# Patient Record
Sex: Female | Born: 1990 | Race: White | Hispanic: Yes | Marital: Married | Smoking: Never smoker
Health system: Southern US, Community
[De-identification: ages and names within clinical notes are randomized; demographics above are authoritative.]

## PROBLEM LIST (undated history)

## (undated) DIAGNOSIS — Z973 Presence of spectacles and contact lenses: Secondary | ICD-10-CM

## (undated) DIAGNOSIS — R51 Headache: Secondary | ICD-10-CM

## (undated) DIAGNOSIS — N9089 Other specified noninflammatory disorders of vulva and perineum: Secondary | ICD-10-CM

## (undated) DIAGNOSIS — G43109 Migraine with aura, not intractable, without status migrainosus: Secondary | ICD-10-CM

## (undated) DIAGNOSIS — R519 Headache, unspecified: Secondary | ICD-10-CM

## (undated) HISTORY — PX: NO PAST SURGERIES: SHX2092

## (undated) HISTORY — PX: WISDOM TOOTH EXTRACTION: SHX21

## (undated) HISTORY — DX: Headache: R51

## (undated) HISTORY — DX: Headache, unspecified: R51.9

---

## 2017-09-27 ENCOUNTER — Encounter (INDEPENDENT_AMBULATORY_CARE_PROVIDER_SITE_OTHER): Payer: Self-pay

## 2017-09-27 ENCOUNTER — Encounter: Payer: Self-pay | Admitting: Family Medicine

## 2017-09-27 ENCOUNTER — Ambulatory Visit: Payer: 59 | Admitting: Family Medicine

## 2017-09-27 VITALS — BP 118/82 | HR 100 | Temp 98.4°F | Ht 64.0 in | Wt 129.8 lb

## 2017-09-27 DIAGNOSIS — Z7689 Persons encountering health services in other specified circumstances: Secondary | ICD-10-CM | POA: Diagnosis not present

## 2017-09-27 DIAGNOSIS — M5441 Lumbago with sciatica, right side: Secondary | ICD-10-CM | POA: Diagnosis not present

## 2017-09-27 DIAGNOSIS — M25531 Pain in right wrist: Secondary | ICD-10-CM

## 2017-09-27 DIAGNOSIS — G44219 Episodic tension-type headache, not intractable: Secondary | ICD-10-CM

## 2017-09-27 DIAGNOSIS — G8929 Other chronic pain: Secondary | ICD-10-CM
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
## 2017-09-27 NOTE — Progress Notes (Signed)
xsc

## 2017-09-27 NOTE — Patient Instructions (Addendum)
Can take liquid tylenol- 500 mg or advil 200-400 mg (generic is fine)    Back Exercises If you have pain in your back, do these exercises 2-3 times each day or as told by your doctor. When the pain goes away, do the exercises once each day, but repeat the steps more times for each exercise (do more repetitions). If you do not have pain in your back, do these exercises once each day or as told by your doctor. Exercises Single Knee to Chest  Do these steps 3-5 times in a row for each leg: 1. Lie on your back on a firm bed or the floor with your legs stretched out. 2. Bring one knee to your chest. 3. Hold your knee to your chest by grabbing your knee or thigh. 4. Pull on your knee until you feel a gentle stretch in your lower back. 5. Keep doing the stretch for 10-30 seconds. 6. Slowly let go of your leg and straighten it.  Pelvic Tilt  Do these steps 5-10 times in a row: 1. Lie on your back on a firm bed or the floor with your legs stretched out. 2. Bend your knees so they point up to the ceiling. Your feet should be flat on the floor. 3. Tighten your lower belly (abdomen) muscles to press your lower back against the floor. This will make your tailbone point up to the ceiling instead of pointing down to your feet or the floor. 4. Stay in this position for 5-10 seconds while you gently tighten your muscles and breathe evenly.  Cat-Cow  Do these steps until your lower back bends more easily: 1. Get on your hands and knees on a firm surface. Keep your hands under your shoulders, and keep your knees under your hips. You may put padding under your knees. 2. Let your head hang down, and make your tailbone point down to the floor so your lower back is round like the back of a cat. 3. Stay in this position for 5 seconds. 4. Slowly lift your head and make your tailbone point up to the ceiling so your back hangs low (sags) like the back of a cow. 5. Stay in this position for 5  seconds.  Press-Ups  Do these steps 5-10 times in a row: 1. Lie on your belly (face-down) on the floor. 2. Place your hands near your head, about shoulder-width apart. 3. While you keep your back relaxed and keep your hips on the floor, slowly straighten your arms to raise the top half of your body and lift your shoulders. Do not use your back muscles. To make yourself more comfortable, you may change where you place your hands. 4. Stay in this position for 5 seconds. 5. Slowly return to lying flat on the floor.  Bridges  Do these steps 10 times in a row: 1. Lie on your back on a firm surface. 2. Bend your knees so they point up to the ceiling. Your feet should be flat on the floor. 3. Tighten your butt muscles and lift your butt off of the floor until your waist is almost as high as your knees. If you do not feel the muscles working in your butt and the back of your thighs, slide your feet 1-2 inches farther away from your butt. 4. Stay in this position for 3-5 seconds. 5. Slowly lower your butt to the floor, and let your butt muscles relax.  If this exercise is too easy, try doing it  with your arms crossed over your chest. Belly Crunches  Do these steps 5-10 times in a row: 1. Lie on your back on a firm bed or the floor with your legs stretched out. 2. Bend your knees so they point up to the ceiling. Your feet should be flat on the floor. 3. Cross your arms over your chest. 4. Tip your chin a little bit toward your chest but do not bend your neck. 5. Tighten your belly muscles and slowly raise your chest just enough to lift your shoulder blades a tiny bit off of the floor. 6. Slowly lower your chest and your head to the floor.  Back Lifts Do these steps 5-10 times in a row: 1. Lie on your belly (face-down) with your arms at your sides, and rest your forehead on the floor. 2. Tighten the muscles in your legs and your butt. 3. Slowly lift your chest off of the floor while you keep  your hips on the floor. Keep the back of your head in line with the curve in your back. Look at the floor while you do this. 4. Stay in this position for 3-5 seconds. 5. Slowly lower your chest and your face to the floor.  Contact a doctor if:  Your back pain gets a lot worse when you do an exercise.  Your back pain does not lessen 2 hours after you exercise. If you have any of these problems, stop doing the exercises. Do not do them again unless your doctor says it is okay. Get help right away if:  You have sudden, very bad back pain. If this happens, stop doing the exercises. Do not do them again unless your doctor says it is okay. This information is not intended to replace advice given to you by your health care provider. Make sure you discuss any questions you have with your health care provider. Document Released: 03/26/2010 Document Revised: 07/30/2015 Document Reviewed: 04/17/2014 Elsevier Interactive Patient Education  [employer].

## 2018-04-18 ENCOUNTER — Encounter: Payer: Self-pay | Admitting: Family Medicine

## 2018-04-18 ENCOUNTER — Other Ambulatory Visit: Payer: Self-pay

## 2018-04-18 ENCOUNTER — Ambulatory Visit: Payer: 59 | Admitting: Family Medicine

## 2018-04-18 VITALS — BP 102/74 | HR 78 | Temp 98.3°F | Resp 16 | Ht 64.0 in | Wt 125.8 lb

## 2018-04-18 DIAGNOSIS — Z566 Other physical and mental strain related to work: Secondary | ICD-10-CM

## 2018-04-18 DIAGNOSIS — R0789 Other chest pain: Secondary | ICD-10-CM

## 2018-04-18 NOTE — Progress Notes (Signed)
   Subjective:    Patient ID: Sierra Washington, female    DOB: 1990/05/12, 28 y.o.   MRN: [id number]  HPI This is a 28 yo female who presents today for follow up of singular episode of chest pain that occurred about 8 days ago. She felt tightness, center of chest, had left leg pain, felt off balance. At the time this occurred, she was in the ER with her boyfriend, they had been waiting for 5 hours, she had not eaten in many hours. Symptoms resolved spontaneously within an hour. Has been doing elliptical 30 minutes a day. Never any chest pain with exercise. Eating healthier. Increased work stress. Occasional anxiety at work, she will walk away and take a break and feeling passes.   Past Medical History:  Diagnosis Date  . Frequent headaches    1year    No past surgical history on file. No family history on file. Social History   Tobacco Use  . Smoking status: Never Smoker  . Smokeless tobacco: Never Used  Substance Use Topics  . Alcohol use: Yes    Comment: occ  . Drug use: Never      Review of Systems Per HPI    Objective:   Physical Exam Physical Exam  Constitutional: Oriented to person, place, and time. She appears well-developed and well-nourished.  HENT:  Head: Normocephalic and atraumatic.  Eyes: Conjunctivae are normal.  Neck: Normal range of motion. Neck supple.  Cardiovascular: Normal rate, regular rhythm and normal heart sounds.   Pulmonary/Chest: Effort normal and breath sounds normal.  Musculoskeletal: Normal range of motion. No LE edema.  Neurological: Alert and oriented to person, place, and time.  Skin: Skin is warm and dry.  Psychiatric: Normal mood and affect. Behavior is normal. Judgment and thought content normal.  Vitals reviewed.     BP 102/74 (BP Location: Left Arm, Patient Position: Sitting, Cuff Size: Normal)   Pulse 78   Temp 98.3 F (36.8 C) (Oral)   Resp 16   Ht 5\' 4"  (1.626 m)   Wt 125 lb 12.8 oz (57.1 kg)   SpO2 99%   BMI 21.59 kg/m    Wt Readings from Last 3 Encounters:  04/18/18 125 lb 12.8 oz (57.1 kg)  09/27/17 129 lb 12 oz (58.9 kg)       Assessment & Plan:  1. Other chest pain - not concerning for cardiac etiology, no symptoms with exercise, likely due to stress - she was instructed to follow up if any additional episodes  2. Work stress - encouraged balance with work, leisure, continue exercise, consider adding yoga/medication - follow up prn   Clarene Reamer, FNP-BC  [facility] at [facility], [place]  04/18/2018 11:22 AM

## 2018-04-18 NOTE — Patient Instructions (Signed)
Good to see you today  Let me know if you have any additional symptoms

## 2018-09-07 ENCOUNTER — Ambulatory Visit (INDEPENDENT_AMBULATORY_CARE_PROVIDER_SITE_OTHER)
Admission: RE | Admit: 2018-09-07 | Discharge: 2018-09-07 | Disposition: A | Discharge status: Home / Self Care | Payer: Self-pay | Source: Ambulatory Visit

## 2018-09-07 ENCOUNTER — Encounter: Payer: Self-pay | Admitting: Family Medicine

## 2018-09-07 DIAGNOSIS — H60502 Unspecified acute noninfective otitis externa, left ear: Secondary | ICD-10-CM

## 2018-09-07 MED ORDER — OFLOXACIN 0.3 % OT SOLN: 3.0000 [drp] | Freq: Two times a day (BID) | OTIC | 0 refills | Status: AC

## 2018-09-07 NOTE — ED Provider Notes (Signed)
Virtual Visit via Video Note:  Sierra Washington  initiated request for Telemedicine visit with [facility] team. I connected with Sierra Washington  on 09/07/2018 at 9:02 AM  for a synchronized telemedicine visit using a video enabled HIPPA compliant telemedicine application. I verified that I am speaking with Sierra Washington  using two identifiers. [place] Hall-Potvin, PA-C  was physically located in a [facility] site and Daron Breeding was located at a different location.   The limitations of evaluation and management by telemedicine as well as the availability of in-person appointments were discussed. Patient was informed that she  may incur a bill ( including co-pay) for this virtual visit encounter. Sierra Washington  expressed understanding and gave verbal consent to proceed with virtual visit.     History of Present Illness:Sierra Washington  is a 28 y.o. female presents with acute concern of left ear pain.  Patient states that her pain started on Monday with some external ear swelling, particularly near her tragus.  Patient states that her ear was tender palpation.  Patient states that her swelling and pain is improved, though now she has had some ear canal swelling, and yellow, creamy discharge.  [place]  Patient denies malodor, fever, chills, myalgias, eye/nose/throat/jaw pain.  Patient denies significant ear swelling.  Patient states that her hearing is a little muffled, though "is not that bad ".  Patient denies tinnitus, dizziness, bloody discharge.  Past Medical History:  Diagnosis Date  . Frequent headaches    1year     No Known Allergies      Observations/Objective: 28 year old female who is well-nourished, well-developed in no acute distress.  Is not muffled and patient is able to speak clearly without pausing, gasping, wheezing.  Assessment and Plan: 28 year old female with acute left ear pain and swelling.   History consistent with acute otitis externa.  Will treat with ofloxacin drops, defer steroid drops as patient was unable to be evaluated, unsure if TM is perforated, though not likely.  Follow Up Instructions: Patient instructed to follow-up in office if symptoms do not improve within the next 48-72 hours or should she develop fever.  Patient verbalized understanding.   I discussed the assessment and treatment plan with the patient. The patient was provided an opportunity to ask questions and all were answered. The patient agreed with the plan and demonstrated an understanding of the instructions.   The patient was advised to call back or seek an in-person evaluation if the symptoms worsen or if the condition fails to improve as anticipated.  I provided 15 minutes of non-face-to-face time during this encounter.    [place] Hall-Potvin, PA-C  09/07/2018 9:02 AM        Hall-Potvin, [place], PA-C 09/07/18 [phone]

## 2018-09-07 NOTE — Discharge Instructions (Addendum)
Use eardrops as directed

## 2018-09-10 NOTE — Telephone Encounter (Signed)
See other mychart message also

## 2019-03-09 ENCOUNTER — Emergency Department (HOSPITAL_COMMUNITY)
Admission: EM | Admit: 2019-03-09 | Discharge: 2019-03-10 | Disposition: A | Discharge status: Home / Self Care | Payer: BC Managed Care – PPO | Attending: Emergency Medicine | Admitting: Emergency Medicine

## 2019-03-09 ENCOUNTER — Emergency Department (HOSPITAL_COMMUNITY): Payer: BC Managed Care – PPO

## 2019-03-09 ENCOUNTER — Other Ambulatory Visit: Payer: Self-pay

## 2019-03-09 ENCOUNTER — Encounter (HOSPITAL_COMMUNITY): Payer: Self-pay | Admitting: Emergency Medicine

## 2019-03-09 DIAGNOSIS — R519 Headache, unspecified: Secondary | ICD-10-CM | POA: Diagnosis not present

## 2019-03-09 DIAGNOSIS — H539 Unspecified visual disturbance: Secondary | ICD-10-CM | POA: Insufficient documentation

## 2019-03-09 DIAGNOSIS — Z79899 Other long term (current) drug therapy: Secondary | ICD-10-CM | POA: Diagnosis not present

## 2019-03-09 LAB — PROTIME-INR
INR: 1 (ref 0.8–1.2)
Prothrombin Time: 12.6 seconds (ref 11.4–15.2)

## 2019-03-09 LAB — CBC
HCT: 44.1 % (ref 36.0–46.0)
Hemoglobin: 14.6 g/dL (ref 12.0–15.0)
MCH: 29.3 pg (ref 26.0–34.0)
MCHC: 33.1 g/dL (ref 30.0–36.0)
MCV: 88.6 fL (ref 80.0–100.0)
Platelets: 435 10*3/uL — ABNORMAL HIGH (ref 150–400)
RBC: 4.98 MIL/uL (ref 3.87–5.11)
RDW: 11.3 % — ABNORMAL LOW (ref 11.5–15.5)
WBC: 6.4 10*3/uL (ref 4.0–10.5)
nRBC: 0 % (ref 0.0–0.2)

## 2019-03-09 LAB — DIFFERENTIAL
Abs Immature Granulocytes: 0.01 10*3/uL (ref 0.00–0.07)
Basophils Absolute: 0 10*3/uL (ref 0.0–0.1)
Basophils Relative: 1 %
Eosinophils Absolute: 0 10*3/uL (ref 0.0–0.5)
Eosinophils Relative: 1 %
Immature Granulocytes: 0 %
Lymphocytes Relative: 35 %
Lymphs Abs: 2.2 10*3/uL (ref 0.7–4.0)
Monocytes Absolute: 0.5 10*3/uL (ref 0.1–1.0)
Monocytes Relative: 7 %
Neutro Abs: 3.6 10*3/uL (ref 1.7–7.7)
Neutrophils Relative %: 56 %

## 2019-03-09 LAB — APTT: aPTT: 29 seconds (ref 24–36)

## 2019-03-09 LAB — COMPREHENSIVE METABOLIC PANEL
ALT: 19 U/L (ref 0–44)
AST: 21 U/L (ref 15–41)
Albumin: 3.9 g/dL (ref 3.5–5.0)
Alkaline Phosphatase: 56 U/L (ref 38–126)
Anion gap: 9 (ref 5–15)
BUN: 7 mg/dL (ref 6–20)
CO2: 25 mmol/L (ref 22–32)
Calcium: 9.2 mg/dL (ref 8.9–10.3)
Chloride: 104 mmol/L (ref 98–111)
Creatinine, Ser: 0.58 mg/dL (ref 0.44–1.00)
GFR calc Af Amer: >60 mL/min (ref >60)
GFR calc non Af Amer: >60 mL/min (ref >60)
Glucose, Bld: 119 mg/dL — ABNORMAL HIGH (ref 70–99)
Potassium: 4 mmol/L (ref 3.5–5.1)
Sodium: 138 mmol/L (ref 135–145)
Total Bilirubin: 0.4 mg/dL (ref 0.3–1.2)
Total Protein: 7.7 g/dL (ref 6.5–8.1)

## 2019-03-09 LAB — I-STAT BETA HCG BLOOD, ED (MC, WL, AP ONLY): I-stat hCG, quantitative: <5 m[IU]/mL (ref <5)

## 2019-03-09 LAB — I-STAT CHEM 8, ED
BUN: 7 mg/dL (ref 6–20)
Calcium, Ion: 1.14 mmol/L — ABNORMAL LOW (ref 1.15–1.40)
Chloride: 103 mmol/L (ref 98–111)
Creatinine, Ser: 0.6 mg/dL (ref 0.44–1.00)
Glucose, Bld: 117 mg/dL — ABNORMAL HIGH (ref 70–99)
HCT: 43 % (ref 36.0–46.0)
Hemoglobin: 14.6 g/dL (ref 12.0–15.0)
Potassium: 4 mmol/L (ref 3.5–5.1)
Sodium: 139 mmol/L (ref 135–145)
TCO2: 26 mmol/L (ref 22–32)

## 2019-03-09 MED ORDER — METOCLOPRAMIDE HCL 5 MG/ML IJ SOLN
10.0000 mg | Freq: Once | INTRAMUSCULAR | Status: AC
  Administered 2019-03-09: 10 mg via INTRAMUSCULAR
  Filled 2019-03-09: qty 2

## 2019-03-09 MED ORDER — DIPHENHYDRAMINE HCL 50 MG/ML IJ SOLN
25.0000 mg | Freq: Once | INTRAMUSCULAR | Status: AC
  Administered 2019-03-09: 25 mg via INTRAMUSCULAR
  Filled 2019-03-09: qty 1
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
## 2019-03-09 NOTE — ED Triage Notes (Signed)
Pt reports bilateral blurred vision and loss of peripheral vision in left side since 11. Saw her eye doctor today and sent for CT or MRI. Hx of migraines

## 2019-03-09 NOTE — ED Notes (Signed)
Given Sprite and crackers. Called MRI, third in line. Pt updated on wait time and plan of care.

## 2019-03-09 NOTE — ED Provider Notes (Signed)
11:45 PM  Assumed care from Dr. Johnney Killian.  Patient with L peripheral vision loss that is mostly resolved.  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  Sent from ophthalmology.  MRI brain and MRV brain pending.  1:20 AM  Pt's MRIs are normal.  Patient reports symptoms are improving.  Will discharge home with outpatient neurology follow-up.  Sierra Washington, Husband, [phone] Spoke to husband and gave update by phone.   At this time, I do not feel there is any life-threatening condition present. I have reviewed, interpreted and discussed all results (EKG, imaging, lab, urine as appropriate) and exam findings with patient/family. I have reviewed nursing notes and appropriate previous records.  I feel the patient is safe to be discharged home without further emergent workup and can continue workup as an outpatient as needed. Discussed usual and customary return precautions. Patient/family verbalize understanding and are comfortable with this plan.  Outpatient follow-up has been provided as needed. All questions have been answered.   Sierra Washington was evaluated in Emergency Department on 03/10/2019 for the symptoms described in the history of present illness. She was evaluated in the context of the global COVID-19 pandemic, which necessitated consideration that the patient might be at risk for infection with the SARS-CoV-2 virus that causes COVID-19. Institutional protocols and algorithms that pertain to the evaluation of patients at risk for COVID-19 are in a state of rapid change based on information released by regulatory bodies including the CDC and federal and state organizations. These policies and algorithms were followed during the patient's care in the ED.    Sierra Washington, Sierra Bison, DO 03/10/19 0128

## 2019-03-10 MED ORDER — GADOBUTROL 1 MMOL/ML IV SOLN
5.0000 mL | Freq: Once | INTRAVENOUS | Status: AC | PRN
  Administered 2019-03-09: 5 mL via INTRAVENOUS

## 2019-03-10 NOTE — Discharge Instructions (Addendum)
You were seen in the emergency department for vision changes after a normal eye exam.  Your MRIs today were normal.  There is no sign of stroke, mass, blood clot or other acute abnormality today.  Your labs are also normal.  Your symptoms may have been caused from an ocular migraine.  These are type of migraine headaches that can cause neurologic symptoms such as vision changes.  I recommend close follow-up with neurology as an outpatient.  If you develop severe, sudden onset or worst headache of your life, numbness or weakness on one side of your body, new vision changes or loss of vision, changes in speech, confusion, fever of 100.4 or higher, please return to the emergency department.

## 2019-03-19 ENCOUNTER — Encounter: Payer: Self-pay | Admitting: Diagnostic Neuroimaging

## 2019-03-19 ENCOUNTER — Other Ambulatory Visit: Payer: Self-pay

## 2019-03-19 ENCOUNTER — Ambulatory Visit (INDEPENDENT_AMBULATORY_CARE_PROVIDER_SITE_OTHER): Payer: BC Managed Care – PPO | Admitting: Diagnostic Neuroimaging

## 2019-03-19 VITALS — BP 108/70 | HR 64 | Temp 97.9°F | Ht 66.0 in | Wt 127.6 lb

## 2019-03-19 DIAGNOSIS — G43109 Migraine with aura, not intractable, without status migrainosus: Secondary | ICD-10-CM

## 2019-03-19 NOTE — Progress Notes (Signed)
[facility]  PATIENT: Sierra Washington DOB: 15-Feb-1991  REFERRING CLINICIAN: Elby Beck, FNP HISTORY FROM: patient  REASON FOR VISIT: new consult    HISTORICAL  CHIEF COMPLAINT:  Chief Complaint  Patient presents with  . Visual Field Change    rm 6 New Pt, "loss of peripheral vision, severe near-sightedness;  eye dr stated no retinal tear; extremely light sensitive; EDwork-up was clear, probably ocular migraine"    HISTORY OF PRESENT ILLNESS:   73-year-old female here for evaluation of transient visual disturbance.  03/09/2019 patient had sudden onset of blurred vision followed by left-sided visual loss.  She had some photophobia and nausea at the time.  She also saw some squiggly lines and distorted vision.  Symptoms lasted for several hours.  Patient went to emergency room for evaluation, had MRI, MRV which were negative.  Patient was diagnosed with possible ocular migraine.  Patient has history of headaches for past 5 to 10 years consisting of frontal and left occipital pain, sharp sensation, associate with photophobia and nausea.  Patient may have headaches 2-3 times a week.  Headaches can last hours at a time.  No specific triggering factors.  Patient tends to be sensitive to light especially sunlight and computer screens.  Sometimes uses sunglasses indoors.  She prefers to have lights dimmed indoors.  No family history of migraine.  Patient has used Tylenol herself for some of her headaches with mild relief.   REVIEW OF SYSTEMS: Full 14 system review of systems performed and negative with exception of: As per HPI.  ALLERGIES: No Known Allergies  HOME MEDICATIONS: Outpatient Medications Prior to Visit  Medication Sig Dispense Refill  . SPRINTEC 28 0.25-35 MG-MCG tablet daily.     No facility-administered medications prior to visit.    PAST MEDICAL HISTORY: Past Medical History:  Diagnosis Date  . Frequent headaches    1year     PAST  SURGICAL HISTORY: Past Surgical History:  Procedure Laterality Date  . WISDOM TOOTH EXTRACTION     3 removed    FAMILY HISTORY: No family history on file.  SOCIAL HISTORY: Social History   Socioeconomic History  . Marital status: Married    Spouse name: Josh  . Number of children: 0  . Years of education: Not on file  . Highest education level: Bachelor's degree (e.g., BA, AB, BS)  Occupational History  . Not on file  Tobacco Use  . Smoking status: Never Smoker  . Smokeless tobacco: Never Used  Substance and Sexual Activity  . Alcohol use: Yes    Comment: occ  . Drug use: Never  . Sexual activity: Yes    Partners: Male    Birth control/protection: Condom  Other Topics Concern  . Not on file  Social History Narrative   Lives with husband   Grew up in [place], went to college in [place], lived in [place] and [place]   Works for [employer]   Enjoys hiking, biking   Caffeine- coffee 1 c daily   Social Determinants of Health   Financial Resource Strain:   . Difficulty of Paying Living Expenses: Not on file  Food Insecurity:   . Worried About Charity fundraiser in the Last Year: Not on file  . Ran Out of Food in the Last Year: Not on file  Transportation Needs:   . Lack of Transportation (Medical): Not on file  . Lack of Transportation (Non-Medical): Not on file  Physical Activity:   . Days of  Exercise per Week: Not on file  . Minutes of Exercise per Session: Not on file  Stress:   . Feeling of Stress : Not on file  Social Connections:   . Frequency of Communication with Friends and Family: Not on file  . Frequency of Social Gatherings with Friends and Family: Not on file  . Attends Religious Services: Not on file  . Active Member of Clubs or Organizations: Not on file  . Attends Archivist Meetings: Not on file  . Marital Status: Not on file  Intimate Partner Violence:   . Fear of Current or Ex-Partner: Not on file  . Emotionally Abused: Not  on file  . Physically Abused: Not on file  . Sexually Abused: Not on file     PHYSICAL EXAM  GENERAL EXAM/CONSTITUTIONAL: Vitals:  Vitals:   03/19/19 1554  BP: 108/70  Pulse: 64  Temp: 97.9 F (36.6 C)  Weight: 127 lb 9.6 oz (57.9 kg)  Height: 5\' 6"  (1.676 m)     Body mass index is 20.6 kg/m. Wt Readings from Last 3 Encounters:  03/19/19 127 lb 9.6 oz (57.9 kg)  03/09/19 130 lb (59 kg)  04/18/18 125 lb 12.8 oz (57.1 kg)     Patient is in no distress; well developed, nourished and groomed; neck is supple  CARDIOVASCULAR:  Examination of carotid arteries is normal; no carotid bruits  Regular rate and rhythm, no murmurs  Examination of peripheral vascular system by observation and palpation is normal  EYES:  Ophthalmoscopic exam of optic discs and posterior segments is normal; no papilledema or hemorrhages  No exam data present  MUSCULOSKELETAL:  Gait, strength, tone, movements noted in Neurologic exam below  NEUROLOGIC: MENTAL STATUS:  No flowsheet data found.  awake, alert, oriented to person, place and time  recent and remote memory intact  normal attention and concentration  language fluent, comprehension intact, naming intact  fund of knowledge appropriate  CRANIAL NERVE:   2nd - no papilledema on fundoscopic exam  2nd, 3rd, 4th, 6th - pupils equal and reactive to light, visual fields full to confrontation, extraocular muscles intact, no nystagmus  5th - facial sensation symmetric  7th - facial strength symmetric  8th - hearing intact  9th - palate elevates symmetrically, uvula midline  11th - shoulder shrug symmetric  12th - tongue protrusion midline  MOTOR:   normal bulk and tone, full strength in the BUE, BLE  SENSORY:   normal and symmetric to light touch, temperature, vibration  COORDINATION:   finger-nose-finger, fine finger movements normal  REFLEXES:   deep tendon reflexes present and symmetric  GAIT/STATION:     narrow based gait     DIAGNOSTIC DATA (LABS, IMAGING, TESTING) - I reviewed patient records, labs, notes, testing and imaging myself where available.  Lab Results  Component Value Date   WBC 6.4 03/09/2019   HGB 14.6 03/09/2019   HGB 14.6 03/09/2019   HCT 44.1 03/09/2019   HCT 43.0 03/09/2019   MCV 88.6 03/09/2019   PLT 435 (H) 03/09/2019      Component Value Date/Time   NA 138 03/09/2019 1607   NA 139 03/09/2019 1607   K 4.0 03/09/2019 1607   K 4.0 03/09/2019 1607   CL 104 03/09/2019 1607   CL 103 03/09/2019 1607   CO2 25 03/09/2019 1607   GLUCOSE 119 (H) 03/09/2019 1607   GLUCOSE 117 (H) 03/09/2019 1607   BUN 7 03/09/2019 1607   BUN 7 03/09/2019 1607  CREATININE 0.58 03/09/2019 1607   CREATININE 0.60 03/09/2019 1607   CALCIUM 9.2 03/09/2019 1607   PROT 7.7 03/09/2019 1607   ALBUMIN 3.9 03/09/2019 1607   AST 21 03/09/2019 1607   ALT 19 03/09/2019 1607   ALKPHOS 56 03/09/2019 1607   BILITOT 0.4 03/09/2019 1607   GFRNONAA >60 03/09/2019 1607   GFRAA >60 03/09/2019 1607   No results found for: CHOL, HDL, LDLCALC, LDLDIRECT, TRIG, CHOLHDL No results found for: HGBA1C No results found for: VITAMINB12 No results found for: TSH   03/09/19 MRI, MRV head [I reviewed images myself and agree with interpretation. -VRP]  1. Normal brain MRI. No findings to explain patient's symptoms identified. 2. Normal intracranial MRV.     ASSESSMENT AND PLAN  29 y.o. year old female here with new onset episode of transient visual disturbance, associate with photophobia and nausea, also with history of headaches with migraine features. Examination normal.  MRI, MRV normal.  Findings most consistent with migraine with aura.  Dx:  1. Migraine with aura and without status migrainosus, not intractable      PLAN:  MIGRAINE WITH AURA - ibuprofen, tylenol as needed - monitor headache triggers; may consider migraine rx in future  Return for pending if symptoms worsen or fail  to improve.    Penni Bombard, MD [id number], [id number] PM Certified in Neurology, Neurophysiology and Neuroimaging  [facility] [address] [place], [place] [address] [phone]

## 2019-03-19 NOTE — Patient Instructions (Signed)
To prevent or relieve headaches, try the following:   Cool Compress. Lie down and place a cool compress on your head.   Avoid headache triggers. If certain foods or odors seem to have triggered your migraines in the past, avoid them. A headache diary might help you identify triggers.   Include physical activity in your daily routine.   Manage stress. Find healthy ways to cope with the stressors, such as delegating tasks on your to-do list.   Practice relaxation techniques. Try deep breathing, yoga, massage and visualization.   Eat regularly. Eating regularly scheduled meals and maintaining a healthy diet might help prevent headaches. Also, drink plenty of fluids.   Follow a regular sleep schedule. Sleep deprivation might contribute to headaches  Consider biofeedback. With this mind-body technique, you learn to control certain bodily functions -- such as muscle tension, heart rate and blood pressure -- to prevent headaches or reduce headache pain.

## 2019-07-30 ENCOUNTER — Ambulatory Visit (INDEPENDENT_AMBULATORY_CARE_PROVIDER_SITE_OTHER)
Admission: RE | Admit: 2019-07-30 | Discharge: 2019-07-30 | Disposition: A | Discharge status: Home / Self Care | Payer: BC Managed Care – PPO | Source: Ambulatory Visit

## 2019-07-30 ENCOUNTER — Other Ambulatory Visit: Payer: Self-pay

## 2019-07-30 DIAGNOSIS — L03011 Cellulitis of right finger: Secondary | ICD-10-CM

## 2019-07-30 MED ORDER — CEPHALEXIN 250 MG/5ML PO SUSR: 500.0000 mg | Freq: Four times a day (QID) | ORAL | 0 refills | Status: AC

## 2019-07-30 MED ORDER — MUPIROCIN 2 % EX OINT: 1.0000 "application " | TOPICAL_OINTMENT | Freq: Two times a day (BID) | CUTANEOUS | 0 refills | Status: DC

## 2019-07-30 NOTE — Discharge Instructions (Signed)
Please read attached Begin keflex Follow up if not improving

## 2019-07-30 NOTE — ED Provider Notes (Signed)
Virtual Visit via Video Note:  Tauna Hampe  initiated request for Telemedicine visit with [facility] team. I connected with Juluis Pitch  on 07/30/2019 at 4:05 PM  for a synchronized telemedicine visit using a video enabled HIPPA compliant telemedicine application. I verified that I am speaking with Juluis Pitch  using two identifiers. Jeany Seville C Ghazi Rumpf, PA-C  was physically located in a [facility] Urgent care site and Carrissa Neeper was located at a different location.   The limitations of evaluation and management by telemedicine as well as the availability of in-person appointments were discussed. Patient was informed that she  may incur a bill ( including co-pay) for this virtual visit encounter. Laveta Red Oak  expressed understanding and gave verbal consent to proceed with virtual visit.     History of Present Illness:Sierra Washington  is a 29 y.o. female presents for evaluation of fingernail infection.  Patient reports that last week she had a hangnail, on Thursday, approximately 5 days ago began to develop pain and swelling around her right middle finger nail.  Since has also developed spontaneous pustular drainage.  She has been trying to clean with peroxide with gentle massage as well as applying Neosporin.  Denies difficulty bending finger.  Denies pain redness or swelling to tip of finger.    No Known Allergies   Past Medical History:  Diagnosis Date  . Frequent headaches    1year      Social History   Tobacco Use  . Smoking status: Never Smoker  . Smokeless tobacco: Never Used  Substance Use Topics  . Alcohol use: Yes    Comment: occ  . Drug use: Never        Observations/Objective: Physical Exam  Constitutional: She is oriented to person, place, and time and well-developed, well-nourished, and in no distress. No distress.  HENT:  Head: Normocephalic and atraumatic.  Pulmonary/Chest: Effort normal. No respiratory distress.  Speaking full  sentences  Musculoskeletal:     Cervical back: Normal range of motion.     Comments: Full active range of motion of finger at PIP and DIP  Neurological: She is alert and oriented to person, place, and time.  Speech clear, face symmetric  Skin:  Skin around nail of right middle finger does appear erythematous, small area of pus noted just along nail fold     Assessment and Plan:  Paronychia of right middle finger  History and exam consistent with paronychia, do not suspect felon at this time.  Initiating on Keflex as well as having continue to soak and gentle massage to express further drainage.  May apply Bactroban.  Follow-up if symptoms not resolving with the above or spreading to tip of finger.    Follow Up Instructions:     I discussed the assessment and treatment plan with the patient. The patient was provided an opportunity to ask questions and all were answered. The patient agreed with the plan and demonstrated an understanding of the instructions.   The patient was advised to call back or seek an in-person evaluation if the symptoms worsen or if the condition fails to improve as anticipated.      Janith Lima, PA-C  07/30/2019 4:05 PM         Janith Lima, PA-C 07/30/19 1853

## 2020-03-05 IMAGING — MR MR HEAD W/O CM
10 of 11 series · 43 of 48 positions shown · IV contrast (gadavist)
Comparison: Prior head CT from 03/09/2019.

CLINICAL DATA: Initial evaluation for acute blurry vision, loss of
peripheral vision on left.

EXAM:
MRI HEAD WITHOUT  CONTRAST
MRV HEAD WITHOUT AND WITH CONTRAST
TECHNIQUE: Multiplanar, multiecho pulse sequences of the brain and surrounding
structures were obtained without and with intravenous contrast.
Angiographic images of the intracranial venous structures were
obtained using MRV technique without intravenous contrast.
CONTRAST:  5mL GADAVIST GADOBUTROL 1 MMOL/ML IV SOLN

[Series 5: DWI · axial · 3.0mm · 0.88mm/px · z∈[-66,+74]mm · 10 of 96 slices shown (1 of 4)]
[im 1/96]
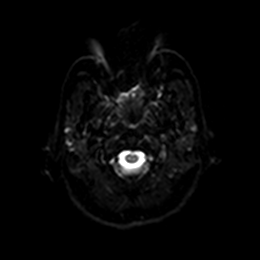
[im 11/96]
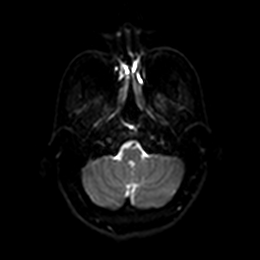
[im 22/96]
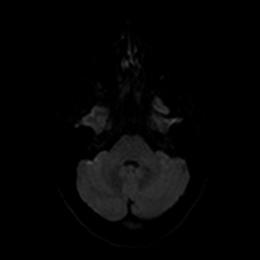
[im 32/96]
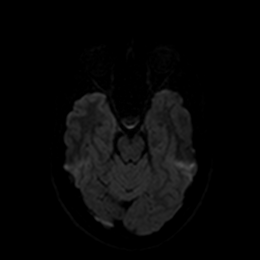
[im 43/96]
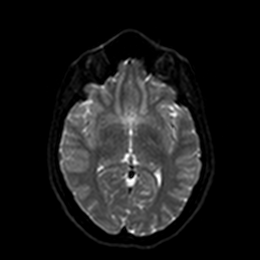
[im 53/96]
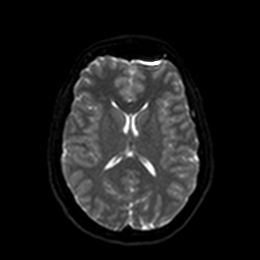
[im 64/96]
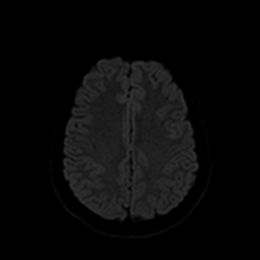
[im 74/96]
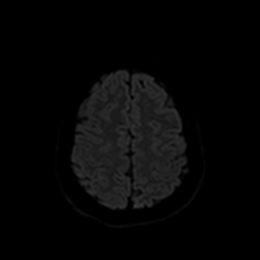
[im 85/96]
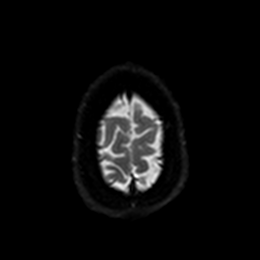
[im 96/96]
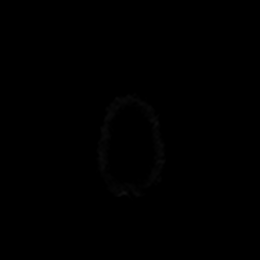

[Series 6: DWI · axial · 3.0mm · 0.88mm/px · z∈[-66,+74]mm · 5 of 48 slices shown (2 of 4)]
[im 1/48]
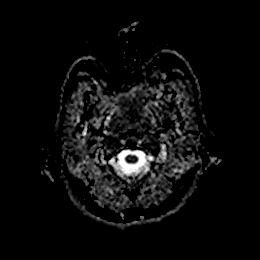
[im 12/48]
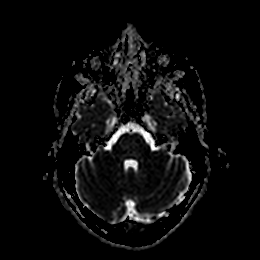
[im 24/48]
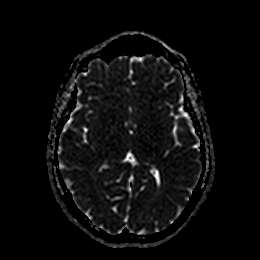
[im 36/48]
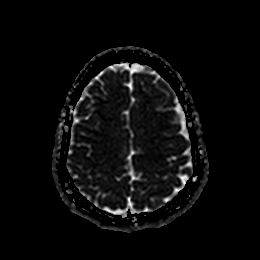
[im 48/48]
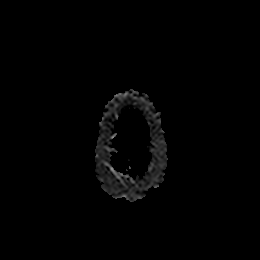

[Series 7: DWI · coronal · 4.0mm · 0.88mm/px · 6 of 68 slices shown (3 of 4)]
[im 1/68]
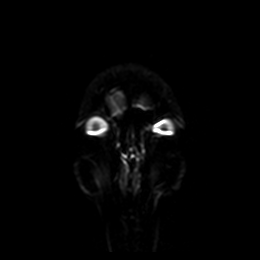
[im 14/68]
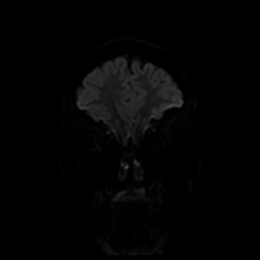
[im 27/68]
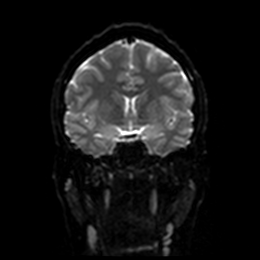
[im 41/68]
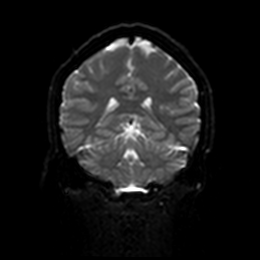
[im 54/68]
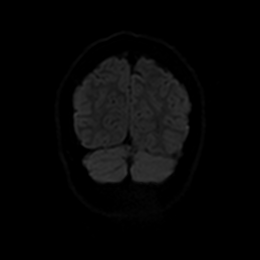
[im 68/68]
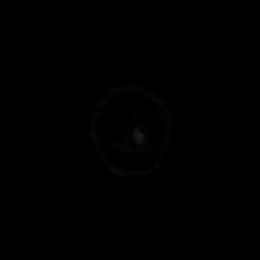

[Series 8: DWI · coronal · 4.0mm · 0.88mm/px · 3 of 34 slices shown (4 of 4)]
[im 1/34]
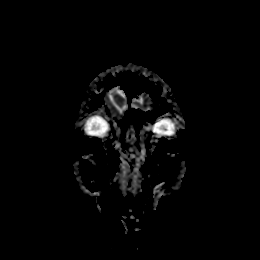
[im 17/34]
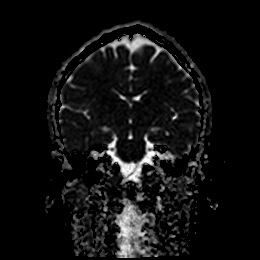
[im 34/34]
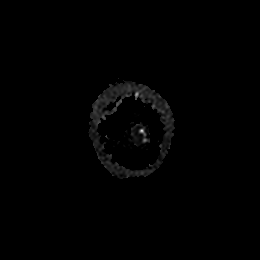

[Series 9: T1 · sagittal · 5.0mm · 0.75mm/px · 2 of 25 slices shown]
[im 1/25]
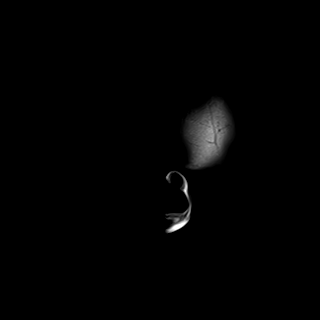
[im 25/25]
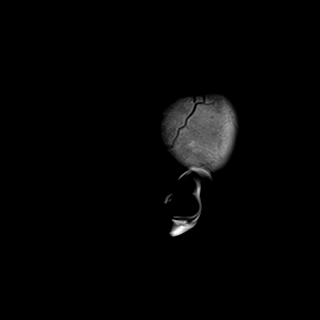

[Series 10: T2 · axial · 5.0mm · 0.72mm/px · z∈[-67,+76]mm · 2 of 25 slices shown (1 of 2)]
[im 1/25]
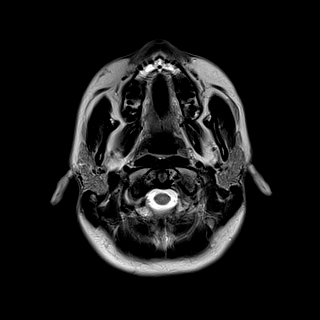
[im 25/25]
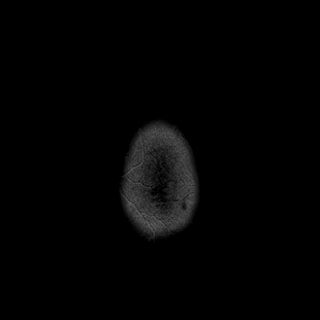

[Series 11: FLAIR · axial · 5.0mm · 0.45mm/px · z∈[-69,+74]mm · 2 of 25 slices shown]
[im 1/25]
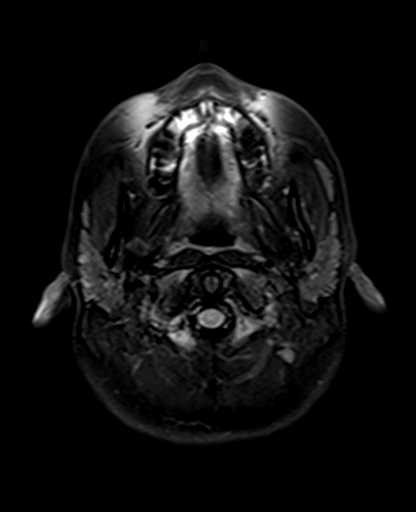
[im 25/25]
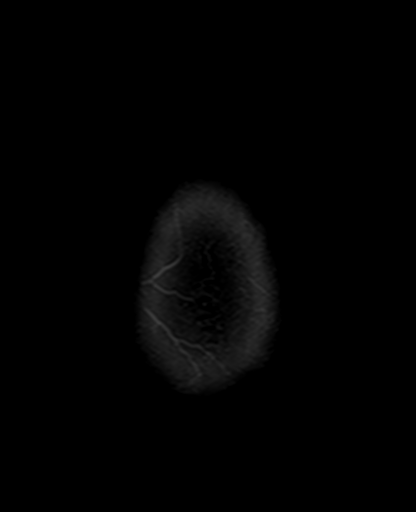

[Series 13: pha_images · axial · 3.0mm · 0.90mm/px · z∈[-85,+85]mm · 5 of 58 slices shown]
[im 1/58]
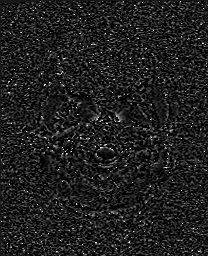
[im 15/58]
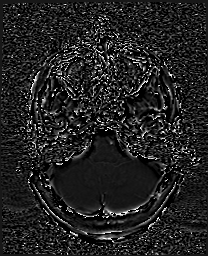
[im 29/58]
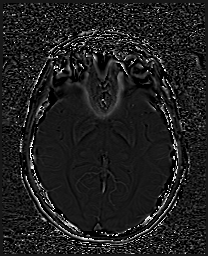
[im 43/58]
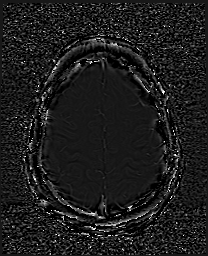
[im 58/58]
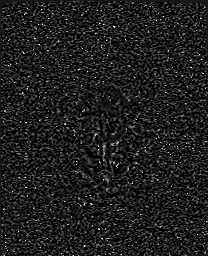

[Series 14: swi_images · axial · 3.0mm · 0.90mm/px · z∈[-85,+91]mm · 5 of 60 slices shown]
[im 1/60]
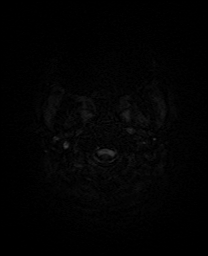
[im 15/60]
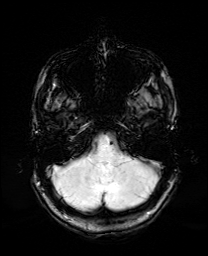
[im 30/60]
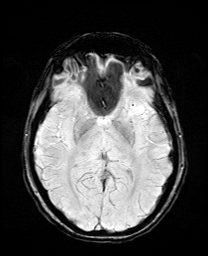
[im 45/60]
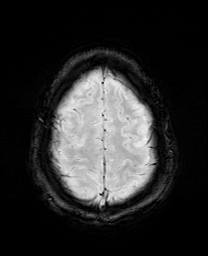
[im 60/60]
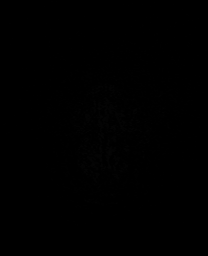

[Series 17: T2 · coronal · 5.0mm · 0.34mm/px · 3 of 29 slices shown (2 of 2)]
[im 1/29]
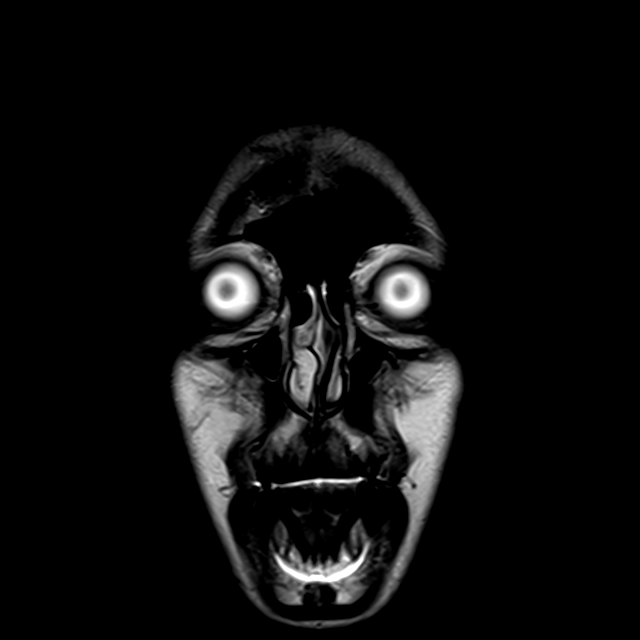
[im 15/29]
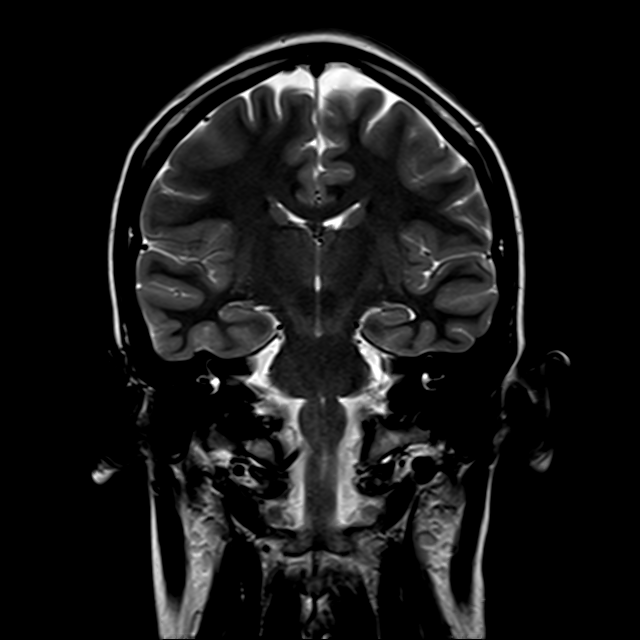
[im 29/29]
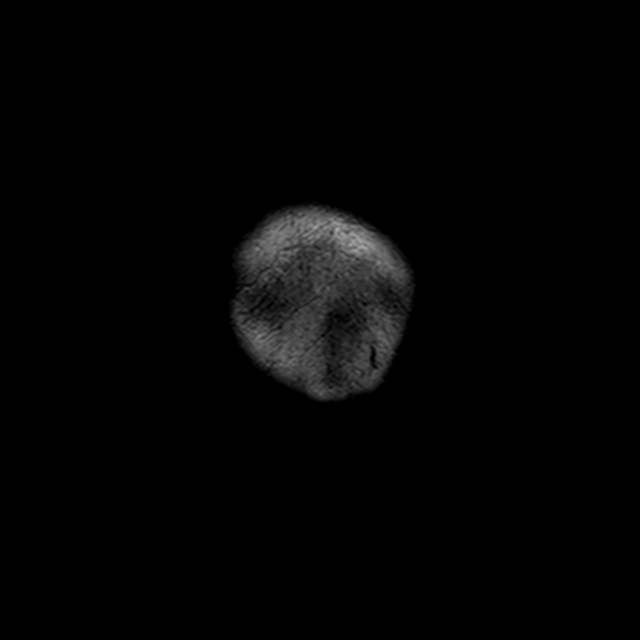

[43 of 48 positions shown; findings below may reference images not displayed]

FINDINGS: MRI HEAD FINDINGS

Brain: Cerebral volume within normal limits for patient age. No
focal parenchymal signal abnormality identified.

No abnormal foci of restricted diffusion to suggest acute or
subacute ischemia. Gray-white matter differentiation well
maintained. No encephalomalacia to suggest chronic infarction. No
foci of susceptibility artifact to suggest acute or chronic
intracranial hemorrhage.

No mass lesion, midline shift or mass effect. No hydrocephalus. No
extra-axial fluid collection. Major dural sinuses are grossly
patent.

Pituitary gland and suprasellar region are normal. Midline
structures intact and normal.

Vascular: Major intracranial vascular flow voids well maintained and
normal in appearance.

Skull and upper cervical spine: Craniocervical junction normal.
Visualized upper cervical spine within normal limits. Bone marrow
signal intensity normal. No scalp soft tissue abnormality.

Sinuses/Orbits: Globes and orbital soft tissues within normal
limits.

Paranasal sinuses are clear. No mastoid effusion. Inner ear
structures normal.

Other: None.

MRV HEAD FINDINGS

Normal flow related signal seen throughout the superior sagittal
sinus to the level of the torcula. Right transverse and sigmoid
sinuses are widely patent as is the proximal right internal jugular
vein. Hypoplastic left transverse and sigmoid sinuses, also patent.
Visualized proximal left internal jugular vein patent. Straight
sinus, vein of Mayko, internal cerebral veins, and basal veins of
Aris patent. No evidence for cavernous sinus thrombosis.
Superior orbital veins grossly symmetric and within normal limits.
No evidence for dural sinus thrombosis, stenosis, or other
abnormality. No other abnormal enhancement seen elsewhere within the
brain.
IMPRESSION: 1. Normal brain MRI. No findings to explain patient's symptoms
identified.
2. Normal intracranial MRV.

## 2020-08-07 NOTE — H&P (Signed)
Sierra Washington is an 30 y.o. female with right vulvar cyst.  She has had this lesion since she was 30 years old and while it has not changed in size, she is bothered by discomfort in certain clothing and during intercourse.  She would like it removed.  On physical exam, cyst measures 1.5 cm and is soft and mobile.  Pertinent Gynecological History: Menses: flow is light Bleeding: regular Contraception: OCP (estrogen/progesterone) DES exposure: unknown Blood transfusions: none Sexually transmitted diseases: no past history Previous GYN Procedures: none  Last mammogram: n/a Date: n/a Last pap: normal Date: 05/2020 OB History: G0   Menstrual History: Menarche age: n/a No LMP recorded. (Menstrual status: Oral contraceptives).    Past Medical History:  Diagnosis Date  . Frequent headaches    1year     Past Surgical History:  Procedure Laterality Date  . WISDOM TOOTH EXTRACTION     3 removed    No family history on file.  Social History:  reports that she has never smoked. She has never used smokeless tobacco. She reports current alcohol use. She reports that she does not use drugs.  Allergies: No Known Allergies  No medications prior to admission.    Review of Systems  There were no vitals taken for this visit. Physical Exam Constitutional:      Appearance: Normal appearance.  HENT:     Head: Normocephalic and atraumatic.  Pulmonary:     Effort: Pulmonary effort is normal.  Abdominal:     Palpations: Abdomen is soft.  Musculoskeletal:        General: Normal range of motion.     Cervical back: Normal range of motion.  Neurological:     Mental Status: She is alert and oriented to person, place, and time.  Psychiatric:        Mood and Affect: Mood normal.        Behavior: Behavior normal.     No results found for this or any previous visit (from the past 24 hour(s)).  No results found.  Assessment/Plan: 29yo with right vulvar cyst -Removal of right vulvar  cyst-patient is counseled re: risk of bleeding, infection, scarring and damage to surrounding structures.  She is informed of soreness with recovery and likelihood of bruising.  She may have pain at this area long term related to nerve injury or scarring.  All questions were answered and patient wishes to proceed.  Linda Hedges 08/07/2020, 7:19 AM

## 2020-08-12 ENCOUNTER — Other Ambulatory Visit: Payer: Self-pay

## 2020-08-12 ENCOUNTER — Encounter (HOSPITAL_BASED_OUTPATIENT_CLINIC_OR_DEPARTMENT_OTHER): Payer: Self-pay | Admitting: Obstetrics & Gynecology

## 2020-08-12 NOTE — Progress Notes (Signed)
Spoke w/ via phone for pre-op interview--- pt Lab needs dos-- CBC, Urine preg, t&s              Lab results------  no COVID test -----patient states asymptomatic no test needed Arrive at -------  0530 on 08-14-2020 NPO after MN NO Solid Food.  Clear liquids from MN until--- 0430 Med rec completed Medications to take morning of surgery ----- NONE Diabetic medication ----- n/a Patient instructed no nail polish to be worn day of surgery Patient instructed to bring photo id and insurance card day of surgery Patient aware to have Driver (ride ) / caregiver    for 24 hours after surgery -- husband, Sierra Washington Patient Special Instructions ----- n/a Pre-Op special Istructions ----- n/a Patient verbalized understanding of instructions that were given at this phone interview. Patient denies shortness of breath, chest pain, fever, cough at this phone interview.

## 2020-08-13 NOTE — Anesthesia Preprocedure Evaluation (Addendum)
Anesthesia Evaluation  Patient identified by MRN, date of birth, ID band Patient awake    Reviewed: Allergy & Precautions, NPO status , Patient's Chart, lab work & pertinent test results  Airway Mallampati: I  TM Distance: >3 FB Neck ROM: Full    Dental no notable dental hx. (+) Dental Advisory Given, Teeth Intact   Pulmonary neg pulmonary ROS,    Pulmonary exam normal breath sounds clear to auscultation       Cardiovascular negative cardio ROS Normal cardiovascular exam Rhythm:Regular Rate:Normal     Neuro/Psych  Headaches,    GI/Hepatic negative GI ROS, Neg liver ROS,   Endo/Other  negative endocrine ROS  Renal/GU negative Renal ROS     Musculoskeletal negative musculoskeletal ROS (+)   Abdominal   Peds  Hematology negative hematology ROS (+)   Anesthesia Other Findings   Reproductive/Obstetrics                           Anesthesia Physical Anesthesia Plan  ASA: 1  Anesthesia Plan: General   Post-op Pain Management:    Induction: Intravenous  PONV Risk Score and Plan: 4 or greater and Ondansetron, Dexamethasone, Midazolam, Diphenhydramine and Treatment may vary due to age or medical condition  Airway Management Planned: LMA  Additional Equipment: None  Intra-op Plan:   Post-operative Plan: Extubation in OR  Informed Consent: I have reviewed the patients History and Physical, chart, labs and discussed the procedure including the risks, benefits and alternatives for the proposed anesthesia with the patient or authorized representative who has indicated his/her understanding and acceptance.     Dental advisory given  Plan Discussed with: CRNA  Anesthesia Plan Comments:        Anesthesia Quick Evaluation

## 2020-08-14 ENCOUNTER — Ambulatory Visit (HOSPITAL_BASED_OUTPATIENT_CLINIC_OR_DEPARTMENT_OTHER)
Admission: RE | Admit: 2020-08-14 | Discharge: 2020-08-14 | Disposition: A | Discharge status: Home / Self Care | Payer: BC Managed Care – PPO | Attending: Obstetrics & Gynecology | Admitting: Obstetrics & Gynecology

## 2020-08-14 ENCOUNTER — Encounter (HOSPITAL_BASED_OUTPATIENT_CLINIC_OR_DEPARTMENT_OTHER)
Admission: RE | Disposition: A | Discharge status: Home / Self Care | Payer: Self-pay | Source: Home / Self Care | Attending: Obstetrics & Gynecology

## 2020-08-14 ENCOUNTER — Ambulatory Visit (HOSPITAL_BASED_OUTPATIENT_CLINIC_OR_DEPARTMENT_OTHER): Payer: BC Managed Care – PPO | Admitting: Anesthesiology

## 2020-08-14 ENCOUNTER — Encounter (HOSPITAL_BASED_OUTPATIENT_CLINIC_OR_DEPARTMENT_OTHER): Payer: Self-pay | Admitting: Obstetrics & Gynecology

## 2020-08-14 DIAGNOSIS — N907 Vulvar cyst: Secondary | ICD-10-CM | POA: Diagnosis not present

## 2020-08-14 DIAGNOSIS — D1772 Benign lipomatous neoplasm of other genitourinary organ: Secondary | ICD-10-CM | POA: Diagnosis not present

## 2020-08-14 HISTORY — DX: Presence of spectacles and contact lenses: Z97.3

## 2020-08-14 HISTORY — DX: Migraine with aura, not intractable, without status migrainosus: G43.109

## 2020-08-14 HISTORY — PX: LESION REMOVAL: SHX5196

## 2020-08-14 HISTORY — DX: Other specified noninflammatory disorders of vulva and perineum: N90.89

## 2020-08-14 LAB — TYPE AND SCREEN
ABO/RH(D): O POS
Antibody Screen: NEGATIVE

## 2020-08-14 LAB — CBC
HCT: 40.7 % (ref 36.0–46.0)
Hemoglobin: 13.5 g/dL (ref 12.0–15.0)
MCH: 30.3 pg (ref 26.0–34.0)
MCHC: 33.2 g/dL (ref 30.0–36.0)
MCV: 91.3 fL (ref 80.0–100.0)
Platelets: 361 10*3/uL (ref 150–400)
RBC: 4.46 MIL/uL (ref 3.87–5.11)
RDW: 11.8 % (ref 11.5–15.5)
WBC: 5.1 10*3/uL (ref 4.0–10.5)
nRBC: 0 % (ref 0.0–0.2)

## 2020-08-14 LAB — ABO/RH: ABO/RH(D): O POS

## 2020-08-14 LAB — POCT PREGNANCY, URINE: Preg Test, Ur: NEGATIVE

## 2020-08-14 SURGERY — EXCISION, LESION, VAGINA
Anesthesia: General | Site: Vagina

## 2020-08-14 MED ORDER — IBUPROFEN 100 MG/5ML PO SUSP: 600.0000 mg | Freq: Four times a day (QID) | ORAL | 0 refills | Status: AC | PRN

## 2020-08-14 MED ORDER — ONDANSETRON HCL 4 MG/2ML IJ SOLN
INTRAMUSCULAR | Status: AC
  Filled 2020-08-14: qty 2

## 2020-08-14 MED ORDER — LIDOCAINE HCL (PF) 2 % IJ SOLN
INTRAMUSCULAR | Status: AC
  Filled 2020-08-14: qty 10

## 2020-08-14 MED ORDER — FENTANYL CITRATE (PF) 100 MCG/2ML IJ SOLN: 25.0000 ug | INTRAMUSCULAR | Status: DC | PRN

## 2020-08-14 MED ORDER — OXYCODONE HCL 5 MG/5ML PO SOLN: 5.0000 mg | Freq: Once | ORAL | Status: DC | PRN

## 2020-08-14 MED ORDER — MIDAZOLAM HCL 5 MG/5ML IJ SOLN
INTRAMUSCULAR | Status: DC | PRN
  Administered 2020-08-14: 2 mg via INTRAVENOUS

## 2020-08-14 MED ORDER — DEXAMETHASONE SODIUM PHOSPHATE 4 MG/ML IJ SOLN: INTRAMUSCULAR | Status: DC | PRN

## 2020-08-14 MED ORDER — FENTANYL CITRATE (PF) 100 MCG/2ML IJ SOLN
INTRAMUSCULAR | Status: DC | PRN
  Administered 2020-08-14 (×6): 25 ug via INTRAVENOUS
  Administered 2020-08-14: 50 ug via INTRAVENOUS

## 2020-08-14 MED ORDER — PROPOFOL 10 MG/ML IV BOLUS
INTRAVENOUS | Status: AC
  Filled 2020-08-14: qty 40

## 2020-08-14 MED ORDER — LIDOCAINE HCL (CARDIAC) PF 100 MG/5ML IV SOSY
PREFILLED_SYRINGE | INTRAVENOUS | Status: DC | PRN
  Administered 2020-08-14: 60 mg via INTRAVENOUS

## 2020-08-14 MED ORDER — FENTANYL CITRATE (PF) 100 MCG/2ML IJ SOLN
INTRAMUSCULAR | Status: AC
  Filled 2020-08-14: qty 2

## 2020-08-14 MED ORDER — DEXAMETHASONE SODIUM PHOSPHATE 4 MG/ML IJ SOLN
INTRAMUSCULAR | Status: DC | PRN
  Administered 2020-08-14: 5 mg via INTRAVENOUS

## 2020-08-14 MED ORDER — ACETAMINOPHEN-CODEINE 120-12 MG/5ML PO SOLN: 5.0000 mL | ORAL | 0 refills | Status: AC | PRN

## 2020-08-14 MED ORDER — BUPIVACAINE-EPINEPHRINE (PF) 0.5% -1:200000 IJ SOLN: INTRAMUSCULAR | Status: DC | PRN

## 2020-08-14 MED ORDER — LIDOCAINE-EPINEPHRINE 1 %-1:100000 IJ SOLN
INTRAMUSCULAR | Status: DC | PRN
  Administered 2020-08-14: 7 mL

## 2020-08-14 MED ORDER — POVIDONE-IODINE 10 % EX SWAB: 2.0000 "application " | Freq: Once | CUTANEOUS | Status: DC

## 2020-08-14 MED ORDER — MIDAZOLAM HCL 2 MG/2ML IJ SOLN
INTRAMUSCULAR | Status: AC
  Filled 2020-08-14: qty 2

## 2020-08-14 MED ORDER — LACTATED RINGERS IV SOLN: INTRAVENOUS | Status: DC

## 2020-08-14 MED ORDER — OXYCODONE HCL 5 MG PO TABS: 5.0000 mg | ORAL_TABLET | Freq: Once | ORAL | Status: DC | PRN

## 2020-08-14 MED ORDER — CLONIDINE HCL (ANALGESIA) 100 MCG/ML EP SOLN: EPIDURAL | Status: DC | PRN

## 2020-08-14 MED ORDER — CEFAZOLIN SODIUM-DEXTROSE 2-4 GM/100ML-% IV SOLN
INTRAVENOUS | Status: AC
  Filled 2020-08-14: qty 100

## 2020-08-14 MED ORDER — PROPOFOL 10 MG/ML IV BOLUS
INTRAVENOUS | Status: DC | PRN
  Administered 2020-08-14: 100 mg via INTRAVENOUS
  Administered 2020-08-14: 50 mg via INTRAVENOUS
  Administered 2020-08-14: 150 mg via INTRAVENOUS

## 2020-08-14 MED ORDER — CEFAZOLIN SODIUM-DEXTROSE 2-4 GM/100ML-% IV SOLN
2.0000 g | INTRAVENOUS | Status: AC
  Administered 2020-08-14: 2 g via INTRAVENOUS

## 2020-08-14 MED ORDER — ONDANSETRON HCL 4 MG/2ML IJ SOLN
INTRAMUSCULAR | Status: DC | PRN
  Administered 2020-08-14: 4 mg via INTRAVENOUS

## 2020-08-14 MED ORDER — AMISULPRIDE (ANTIEMETIC) 5 MG/2ML IV SOLN: 10.0000 mg | Freq: Once | INTRAVENOUS | Status: DC | PRN

## 2020-08-14 MED ORDER — DEXAMETHASONE SODIUM PHOSPHATE 10 MG/ML IJ SOLN
INTRAMUSCULAR | Status: AC
  Filled 2020-08-14: qty 1

## 2020-08-14 MED ORDER — KETOROLAC TROMETHAMINE 30 MG/ML IJ SOLN
INTRAMUSCULAR | Status: DC | PRN
  Administered 2020-08-14: 30 mg via INTRAVENOUS

## 2020-08-14 MED ORDER — MEPERIDINE HCL 25 MG/ML IJ SOLN: 6.2500 mg | INTRAMUSCULAR | Status: DC | PRN

## 2020-08-14 SURGICAL SUPPLY — 20 items
BLADE SURG 15 STRL LF DISP TIS (BLADE) ×1 IMPLANT
BLADE SURG 15 STRL SS (BLADE) ×3
CATH ROBINSON RED A/P 16FR (CATHETERS) IMPLANT
COVER WAND RF STERILE (DRAPES) ×3 IMPLANT
GAUZE 4X4 16PLY RFD (DISPOSABLE) ×3 IMPLANT
GLOVE SURG ENC MOIS LTX SZ7 (GLOVE) ×6 IMPLANT
GLOVE SURG POLYISO LF SZ5.5 (GLOVE) ×6 IMPLANT
GOWN STRL REUS W/TWL LRG LVL3 (GOWN DISPOSABLE) ×9 IMPLANT
KIT TURNOVER CYSTO (KITS) ×3 IMPLANT
MANIFOLD NEPTUNE II (INSTRUMENTS) ×3 IMPLANT
NEEDLE HYPO 25X1 1.5 SAFETY (NEEDLE) ×3 IMPLANT
PACK PERINEAL COLD (PAD) ×3 IMPLANT
PACK VAGINAL WOMENS (CUSTOM PROCEDURE TRAY) ×3 IMPLANT
PAD OB MATERNITY 4.3X12.25 (PERSONAL CARE ITEMS) IMPLANT
SUT VIC AB 3-0 SH 27 (SUTURE) ×3
SUT VIC AB 3-0 SH 27X BRD (SUTURE) ×1 IMPLANT
SUT VIC AB 4-0 SH 27 (SUTURE)
SUT VIC AB 4-0 SH 27XANBCTRL (SUTURE) IMPLANT
TOWEL OR 17X26 10 PK STRL BLUE (TOWEL DISPOSABLE) ×3 IMPLANT
WATER STERILE IRR 500ML POUR (IV SOLUTION) ×3 IMPLANT

## 2020-08-14 NOTE — Anesthesia Procedure Notes (Deleted)
Anesthesia Regional Block: Adductor canal block   Pre-Anesthetic Checklist: , timeout performed,  Correct Patient, Correct Site, Correct Laterality,  Correct Procedure, Correct Position, site marked,  Risks and benefits discussed,  Surgical consent,  Pre-op evaluation,  Post-op pain management  Laterality: Right  Prep: chloraprep       Needles:  Injection technique: Single-shot  Needle Type: Stimiplex     Needle Length: 9cm  Needle Gauge: 21     Additional Needles:   Procedures:,,,, ultrasound used (permanent image in chart),,    Narrative:  Start time: 08/14/2020 7:05 AM End time: 08/14/2020 7:15 AM Injection made incrementally with aspirations every 5 mL.  Performed by: Personally  Anesthesiologist: Nolon Nations, MD  Additional Notes: BP cuff, EKG monitors applied. Sedation begun. Artery and nerve location verified with U/S and anesthetic injected incrementally, slowly, and after negative aspirations under direct u/s guidance. Good fascial /perineural spread. Tolerated well.

## 2020-08-14 NOTE — Anesthesia Procedure Notes (Signed)
Procedure Name: LMA Insertion Date/Time: 08/14/2020 7:35 AM Performed by: Justice Rocher, CRNA Pre-anesthesia Checklist: Patient identified, Emergency Drugs available, Suction available, Patient being monitored and Timeout performed Patient Re-evaluated:Patient Re-evaluated prior to induction Oxygen Delivery Method: Circle system utilized Preoxygenation: Pre-oxygenation with 100% oxygen Induction Type: IV induction Ventilation: Mask ventilation without difficulty LMA: LMA inserted LMA Size: 4.0 Number of attempts: 1 Airway Equipment and Method: Bite block Placement Confirmation: positive ETCO2, CO2 detector and breath sounds checked- equal and bilateral Tube secured with: Tape Dental Injury: Teeth and Oropharynx as per pre-operative assessment

## 2020-08-14 NOTE — Discharge Instructions (Addendum)
Call MD for T>100.4, heavy bleeding, severe pain.  Call to schedule postop visit in 2 weeks.     Post Anesthesia Home Care Instructions  Activity: Get plenty of rest for the remainder of the day. A responsible individual must stay with you for 24 hours following the procedure.  For the next 24 hours, DO NOT: -Drive a car -Paediatric nurse -Drink alcoholic beverages -Take any medication unless instructed by your physician -Make any legal decisions or sign important papers.  Meals: Start with liquid foods such as gelatin or soup. Progress to regular foods as tolerated. Avoid greasy, spicy, heavy foods. If nausea and/or vomiting occur, drink only clear liquids until the nausea and/or vomiting subsides. Call your physician if vomiting continues.  Special Instructions/Symptoms: Your throat may feel dry or sore from the anesthesia or the breathing tube placed in your throat during surgery. If this causes discomfort, gargle with warm salt water. The discomfort should disappear within 24 hours.

## 2020-08-14 NOTE — Op Note (Signed)
Laurieanne Caccavale PROCEDURE DATE: 08/14/2020  PREOPERATIVE DIAGNOSIS: Right vulvar cyst   POSTOPERATIVE DIAGNOSIS: The same  PROCEDURE: Removal of right vulvar cyst  SURGEON:  Dr. Linda Hedges  INDICATIONS: Timeka Goette is a 30 y.o. with symptomatic right vulvar cyst.  Pt requests removal.  FINDINGS:  2 cm mobile, soft cyst of anterior right labia majora .   ANESTHESIA:    General and local  ESTIMATED BLOOD LOSS: <50 cc  SPECIMENS: Cyst of right vulva  COMPLICATIONS: None immediate  PROCEDURE IN DETAIL:  Small mobile cyst palpated in anterior right labia majora.  Written informed consent was obtained.  Discussed complications and possible outcomes of procedure including recurrence of cyst, scarring leading to infecton, bleeding, dyspareunia, distortion of anatomy.  Patient was examined in the dorsal lithotomy position and mass was identified.  The area was prepped in the normal sterile fashion and draped in a sterile manner.  After a time out was performed, a 1.5 cm incision was made using a sterile scapel. Using blunt and sharp dissection, the fatty cyst was identified and removed.  Hemostasis was achieved using Bovie cautery.  3-0 Vicryl was used to reapproximate deep tissue.  Skin edges were reapproximated using 3-0 vicryl in 3 interrupted stitches.  Hemostasis was observed.  7 cc 1% lidocaine with epi was injected at incision.  The patient tolerated the procedure well.  All counts were correct x 2.  The patient went to the recovery room in stable condition.

## 2020-08-14 NOTE — Anesthesia Procedure Notes (Deleted)
Anesthesia Regional Block: Popliteal block   Pre-Anesthetic Checklist: , timeout performed,  Correct Patient, Correct Site, Correct Laterality,  Correct Procedure, Correct Position, site marked,  Risks and benefits discussed,  Pre-op evaluation,  Post-op pain management  Laterality: Right  Prep: chloraprep       Needles:  Injection technique: Single-shot  Needle Type: Stimiplex     Needle Length: 10cm  Needle Gauge: 21     Additional Needles:   Procedures:,,,, ultrasound used (permanent image in chart),,   Motor weakness within 5 minutes.   Nerve Stimulator or Paresthesia:  Response: 0.5 mA  Additional Responses:   Narrative:  Start time: 08/14/2020 7:15 AM End time: 08/14/2020 7:25 AM Injection made incrementally with aspirations every 5 mL.  Performed by: Personally  Anesthesiologist: Nolon Nations, MD  Additional Notes: Nerve located and needle positioned with direct ultrasound guidance. Good perineural spread. Patient tolerated well.

## 2020-08-14 NOTE — Anesthesia Postprocedure Evaluation (Signed)
Anesthesia Post Note  Patient: Sierra Washington  Procedure(s) Performed: EXCISION OF VULVAR MASS (Vagina )     Patient location during evaluation: PACU Anesthesia Type: General Level of consciousness: sedated and patient cooperative Pain management: pain level controlled Vital Signs Assessment: post-procedure vital signs reviewed and stable Respiratory status: spontaneous breathing Cardiovascular status: stable Anesthetic complications: no   No notable events documented.  Last Vitals:  Vitals:   08/14/20 0915 08/14/20 0934  BP: 129/86 133/87  Pulse: (!) 57 77  Resp: 13 14  Temp:  36.4 C  SpO2: 99% 100%    Last Pain:  Vitals:   08/14/20 0934  TempSrc:   PainSc: 0-No pain                 Nolon Nations

## 2020-08-14 NOTE — Transfer of Care (Signed)
Immediate Anesthesia Transfer of Care Note  Patient: Sierra Washington  Procedure(s) Performed: Procedure(s) (LRB): EXCISION OF VULVAR MASS (N/A)  Patient Location: PACU  Anesthesia Type: General  Level of Consciousness: awake, sedated, patient cooperative and responds to stimulation  Airway & Oxygen Therapy: Patient Spontanous Breathing and Patient connected to [place] 02 and soft FM   Post-op Assessment: Report given to PACU RN, Post -op Vital signs reviewed and stable and Patient moving all extremities  Post vital signs: Reviewed and stable  Complications: No apparent anesthesia complications

## 2020-08-14 NOTE — Progress Notes (Signed)
No change to H&P.  Sierra Jeanty, DO 

## 2020-08-17 ENCOUNTER — Encounter (HOSPITAL_BASED_OUTPATIENT_CLINIC_OR_DEPARTMENT_OTHER): Payer: Self-pay | Admitting: Obstetrics & Gynecology

## 2020-08-17 LAB — SURGICAL PATHOLOGY
